# Patient Record
Sex: Male | Born: 1965 | Race: White | Hispanic: No | Marital: Married | Smoking: Never smoker
Health system: Southern US, Community
[De-identification: ages and names within clinical notes are randomized; demographics above are authoritative.]

## PROBLEM LIST (undated history)

## (undated) DIAGNOSIS — N200 Calculus of kidney: Secondary | ICD-10-CM

## (undated) HISTORY — PX: LITHOTRIPSY: SUR834

---

## 2018-02-17 ENCOUNTER — Encounter (HOSPITAL_BASED_OUTPATIENT_CLINIC_OR_DEPARTMENT_OTHER): Payer: Self-pay | Admitting: *Deleted

## 2018-02-17 ENCOUNTER — Emergency Department (HOSPITAL_BASED_OUTPATIENT_CLINIC_OR_DEPARTMENT_OTHER)
Admission: EM | Admit: 2018-02-17 | Discharge: 2018-02-17 | Disposition: A | Discharge status: Home / Self Care | Payer: BLUE CROSS/BLUE SHIELD | Attending: Emergency Medicine | Admitting: Emergency Medicine

## 2018-02-17 ENCOUNTER — Other Ambulatory Visit: Payer: Self-pay

## 2018-02-17 ENCOUNTER — Emergency Department (HOSPITAL_BASED_OUTPATIENT_CLINIC_OR_DEPARTMENT_OTHER): Payer: BLUE CROSS/BLUE SHIELD

## 2018-02-17 DIAGNOSIS — L0231 Cutaneous abscess of buttock: Secondary | ICD-10-CM | POA: Diagnosis not present

## 2018-02-17 DIAGNOSIS — R222 Localized swelling, mass and lump, trunk: Secondary | ICD-10-CM | POA: Diagnosis present

## 2018-02-17 HISTORY — DX: Calculus of kidney: N20.0

## 2018-02-17 LAB — COMPREHENSIVE METABOLIC PANEL
ALBUMIN: 3.6 g/dL (ref 3.5–5.0)
ALK PHOS: 74 U/L (ref 38–126)
ALT: 35 U/L (ref 17–63)
ANION GAP: 10 (ref 5–15)
AST: 23 U/L (ref 15–41)
BUN: 14 mg/dL (ref 6–20)
CALCIUM: 8.5 mg/dL — AB (ref 8.9–10.3)
CO2: 21 mmol/L — AB (ref 22–32)
Chloride: 103 mmol/L (ref 101–111)
Creatinine, Ser: 1.21 mg/dL (ref 0.61–1.24)
GFR calc Af Amer: >60 mL/min (ref >60)
GFR calc non Af Amer: >60 mL/min (ref >60)
GLUCOSE: 107 mg/dL — AB (ref 65–99)
Potassium: 3.7 mmol/L (ref 3.5–5.1)
SODIUM: 134 mmol/L — AB (ref 135–145)
Total Bilirubin: 0.8 mg/dL (ref 0.3–1.2)
Total Protein: 7.1 g/dL (ref 6.5–8.1)

## 2018-02-17 LAB — CBC WITH DIFFERENTIAL/PLATELET
BASOS ABS: 0 10*3/uL (ref 0.0–0.1)
Basophils Relative: 0 %
EOS ABS: 0.1 10*3/uL (ref 0.0–0.7)
Eosinophils Relative: 0 %
HCT: 35.6 % — ABNORMAL LOW (ref 39.0–52.0)
HEMOGLOBIN: 12.2 g/dL — AB (ref 13.0–17.0)
Lymphocytes Relative: 9 %
Lymphs Abs: 1.6 10*3/uL (ref 0.7–4.0)
MCH: 28.3 pg (ref 26.0–34.0)
MCHC: 34.3 g/dL (ref 30.0–36.0)
MCV: 82.6 fL (ref 78.0–100.0)
Monocytes Absolute: 1.4 10*3/uL — ABNORMAL HIGH (ref 0.1–1.0)
Monocytes Relative: 8 %
NEUTROS PCT: 83 %
Neutro Abs: 14 10*3/uL — ABNORMAL HIGH (ref 1.7–7.7)
Platelets: 295 10*3/uL (ref 150–400)
RBC: 4.31 MIL/uL (ref 4.22–5.81)
RDW: 12.5 % (ref 11.5–15.5)
WBC: 17.1 10*3/uL — ABNORMAL HIGH (ref 4.0–10.5)

## 2018-02-17 IMAGING — CT CT ABD-PELV W/ CM
2 of 5 series · 16 of 46 positions shown, 18 images · IV contrast (APPLIED)
Comparison: None.

CLINICAL DATA: Perirectal abscess right buttock

EXAM:
CT ABDOMEN AND PELVIS WITH CONTRAST
TECHNIQUE: Multidetector CT imaging of the abdomen and pelvis was performed
using the standard protocol following bolus administration of
intravenous contrast.
CONTRAST:  100mL [DF] IOPAMIDOL ([DF]) INJECTION 61%

[Series 2: axial st · axial · 0.86mm/px · z∈[-729,-239]mm · 13 of 110 slices shown, 15 images]
[im 6/110  soft-tissue]
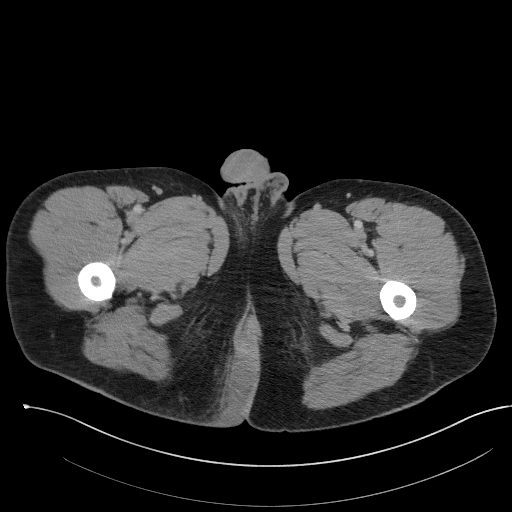
[im 6/110  bone]
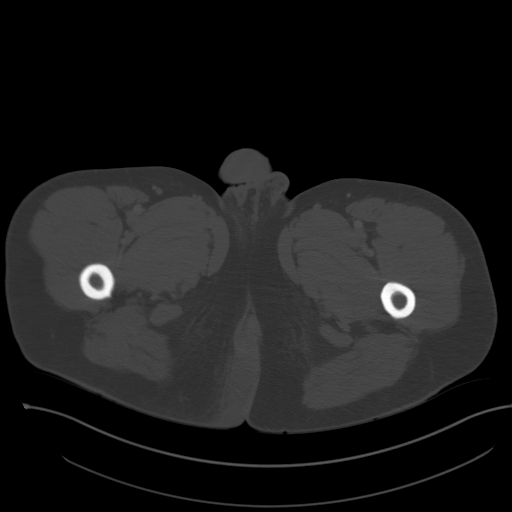
[im 18/110  soft-tissue]
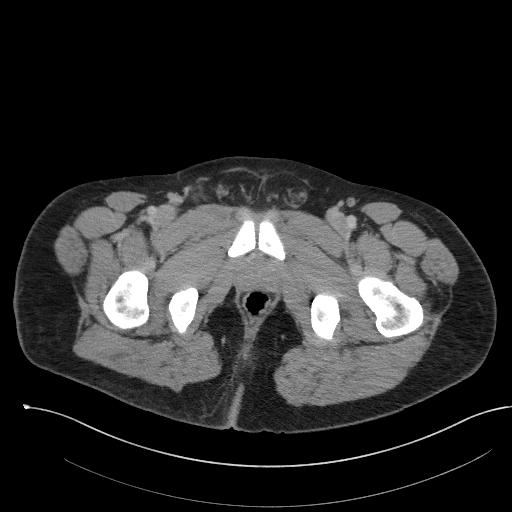
[im 23/110  soft-tissue]
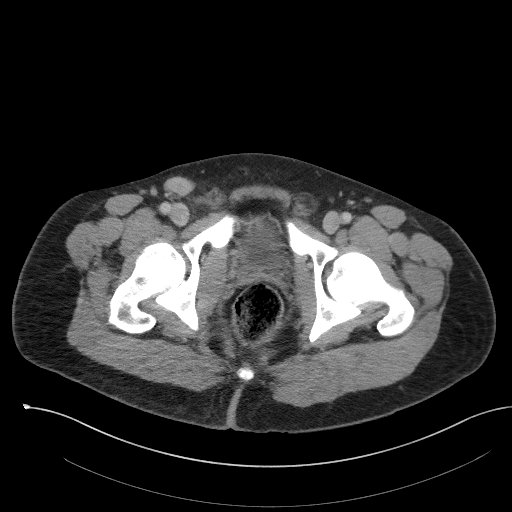
[im 29/110  soft-tissue]
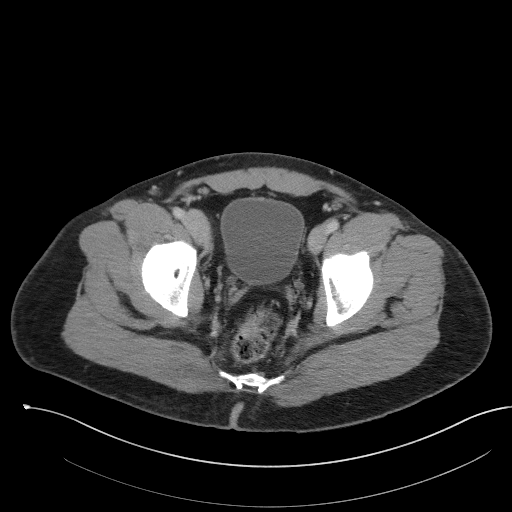
[im 41/110  soft-tissue]
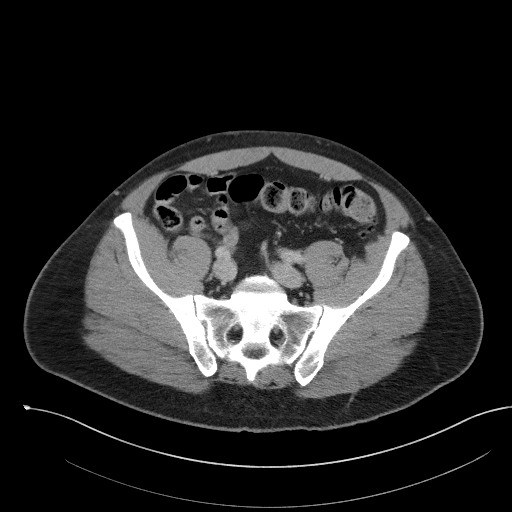
[im 46/110  soft-tissue]
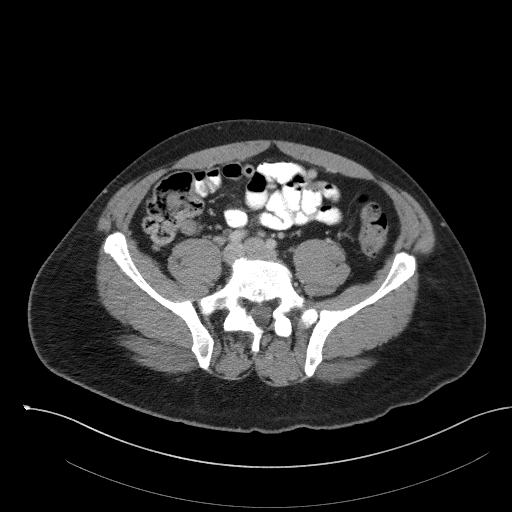
[im 58/110  soft-tissue]
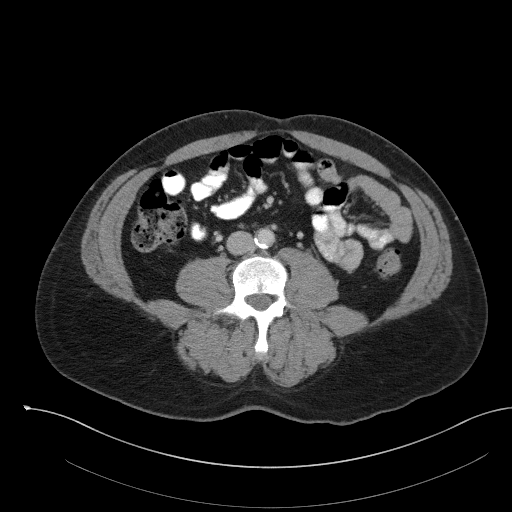
[im 64/110  soft-tissue]
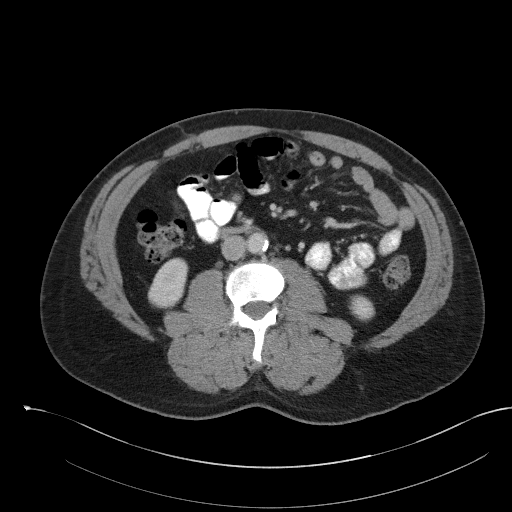
[im 69/110  soft-tissue]
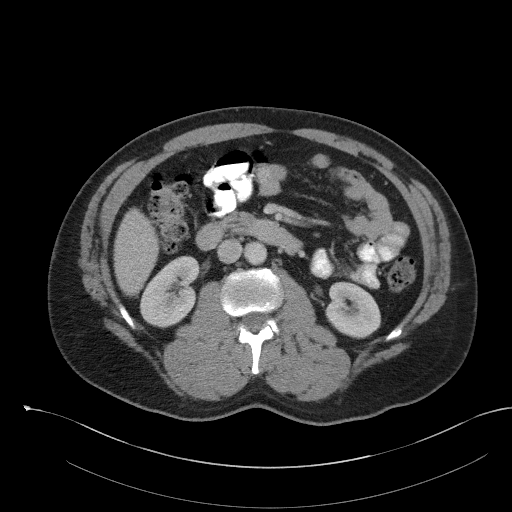
[im 69/110  bone]
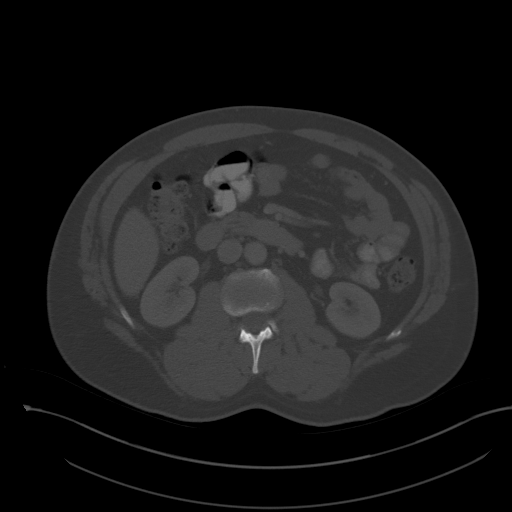
[im 81/110  soft-tissue]
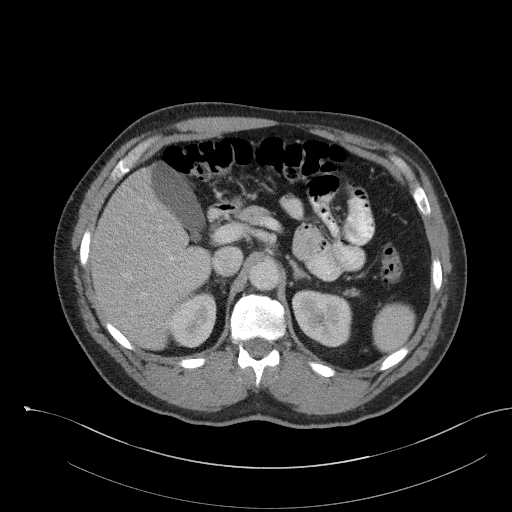
[im 87/110  soft-tissue]
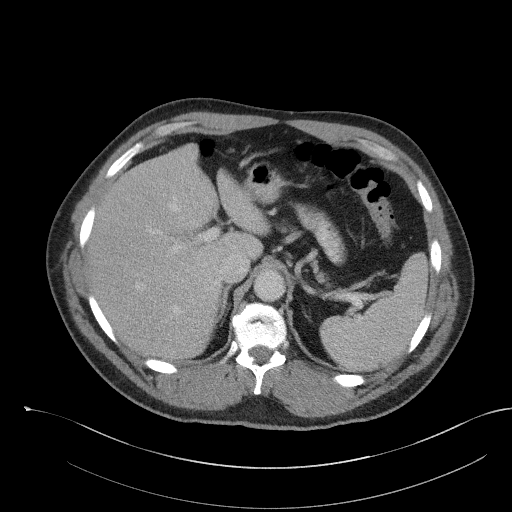
[im 92/110  soft-tissue]
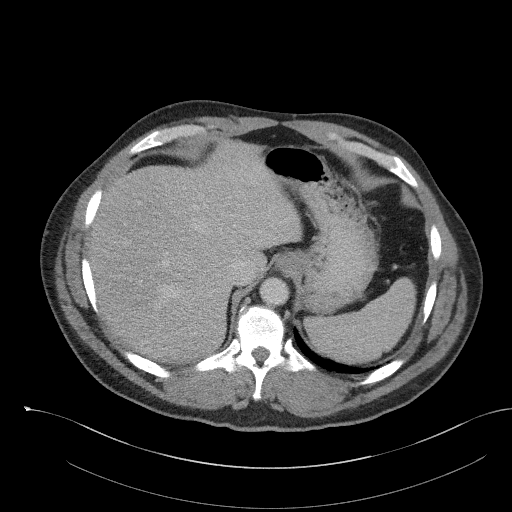
[im 104/110  soft-tissue]
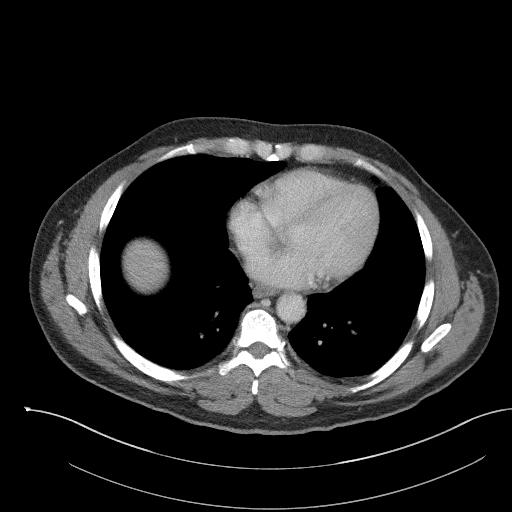

[Series 5: coronal st · coronal · 0.87mm/px · 3 of 101 slices shown]
[im 34/101  soft-tissue]
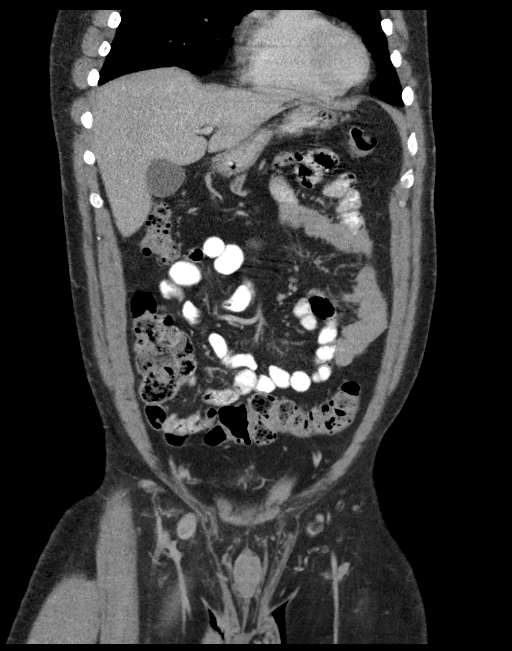
[im 45/101  soft-tissue]
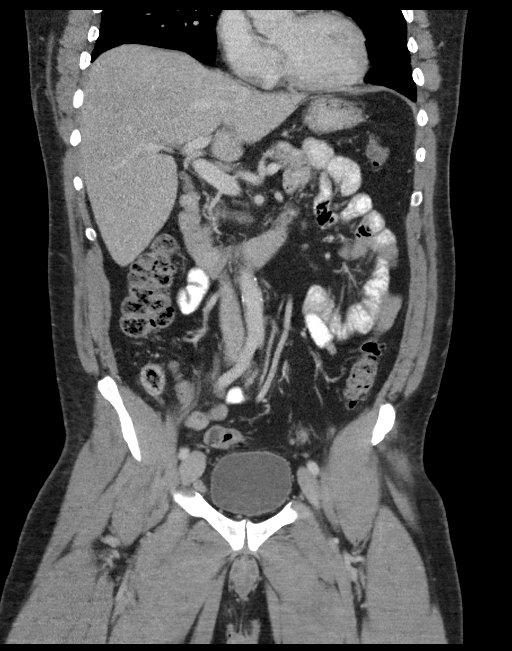
[im 56/101  soft-tissue]
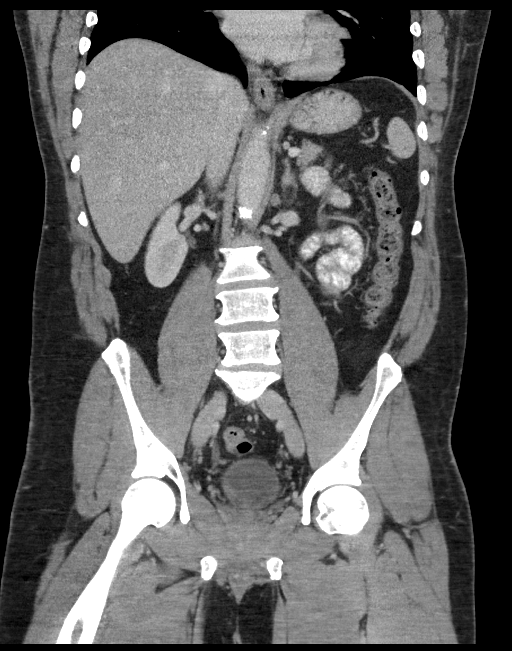

[16 of 46 positions shown; findings below may reference images not displayed]

FINDINGS: Lower chest: Minimal dependent atelectasis in the right lung base.

Hepatobiliary: Mild fatty infiltration of the liver. Gallbladder
unremarkable.

Pancreas: No focal abnormality or ductal dilatation.

Spleen: No focal abnormality.  Normal size.

Adrenals/Urinary Tract: No adrenal abnormality. No focal renal
abnormality. No stones or hydronephrosis. Urinary bladder is
unremarkable.

Stomach/Bowel: Few scattered descending colonic and sigmoid
diverticula. No active diverticulitis. Appendix is normal. Stomach
and small bowel decompressed, unremarkable.

Vascular/Lymphatic: Aortic atherosclerosis. No enlarged abdominal or
pelvic lymph nodes.

Reproductive: No visible focal abnormality.

Other: No free fluid or free air. There is stranding within the
medial right buttock subcutaneous soft tissues with probable fluid
collection measuring 5.9 x 1.9 cm. Findings compatible with early
perirectal abscess or phlegmon.

Musculoskeletal: No acute bony abnormality.
IMPRESSION: 5.9 x 1.9 cm low-density area in the medial buttock soft tissues at
the skin surface which could reflect early abscess or phlegmon.

Left colonic diverticulosis.  No active diverticulitis.

Suspect early/mild fatty infiltration of the liver.

## 2018-02-17 MED ORDER — LIDOCAINE HCL 2 % IJ SOLN
20.0000 mL | Freq: Once | INTRAMUSCULAR | Status: AC
  Administered 2018-02-17: 400 mg via INTRADERMAL
  Filled 2018-02-17: qty 20

## 2018-02-17 MED ORDER — KETOROLAC TROMETHAMINE 30 MG/ML IJ SOLN
30.0000 mg | Freq: Once | INTRAMUSCULAR | Status: AC
  Administered 2018-02-17: 30 mg via INTRAVENOUS
  Filled 2018-02-17: qty 1

## 2018-02-17 MED ORDER — SODIUM CHLORIDE 0.9 % IV BOLUS (SEPSIS)
1000.0000 mL | Freq: Once | INTRAVENOUS | Status: AC
  Administered 2018-02-17: 1000 mL via INTRAVENOUS

## 2018-02-17 MED ORDER — DOXYCYCLINE HYCLATE 100 MG PO CAPS: 100.0000 mg | ORAL_CAPSULE | Freq: Two times a day (BID) | ORAL | 0 refills | Status: AC

## 2018-02-17 MED ORDER — DOXYCYCLINE HYCLATE 100 MG PO TABS
100.0000 mg | ORAL_TABLET | Freq: Once | ORAL | Status: AC
  Administered 2018-02-17: 100 mg via ORAL
  Filled 2018-02-17: qty 1

## 2018-02-17 MED ORDER — IOPAMIDOL (ISOVUE-300) INJECTION 61%
100.0000 mL | Freq: Once | INTRAVENOUS | Status: AC | PRN
  Administered 2018-02-17: 100 mL via INTRAVENOUS

## 2018-02-17 NOTE — ED Provider Notes (Signed)
[facility] EMERGENCY DEPARTMENT Provider Note   CSN: [id number] Arrival date & time: 02/17/18  1637     History   Chief Complaint Chief Complaint  Patient presents with  . Abscess    HPI Xavier Neal is a 52 y.o. male who presents for evaluation of 6 days of right-sided gluteal redness, pain, swelling.  Patient reports that 6 days ago, he noticed some slight irritation to the right gluteal area.  He states that since then, the area has progressively worsened in severity and incised.  He states that he was seen in urgent care earlier today for evaluation of symptoms.  Today at urgent care, he had a temperature of 101.0 and was referred to the emergency department for further evaluation.  Patient states that the pain is worsened by palpation and by sitting.  He reports that he has been taking aspirin and Tylenol with minimal improvement in symptoms.  Patient states he is still able to have normal bowel movements without any difficulty.  Patient denies any testicular pain, swelling, penile pain.  The history is provided by the patient.    Past Medical History:  Diagnosis Date  . Kidney stones     There are no active problems to display for this patient.   Past Surgical History:  Procedure Laterality Date  . LITHOTRIPSY         Home Medications    Prior to Admission medications   Medication Sig Start Date End Date Taking? Authorizing Provider  acetaminophen (TYLENOL) 325 MG tablet Take 1,000 mg by mouth every 6 (six) hours as needed.   Yes [provider]  doxycycline (VIBRAMYCIN) 100 MG capsule Take 1 capsule (100 mg total) by mouth 2 (two) times daily for 7 days. 02/17/18 02/24/18  Maxwell Caul, PA-C    Family History History reviewed. No pertinent family history.  Social History Social History   Tobacco Use  . Smoking status: Never Smoker  . Smokeless tobacco: Never Used  Substance Use Topics  . Alcohol use: No    Frequency: Never  . Drug  use: No     Allergies   Penicillins   Review of Systems Review of Systems  Constitutional: Positive for fever.  Gastrointestinal: Negative for constipation and diarrhea.  Genitourinary: Negative for penile pain, penile swelling, scrotal swelling and testicular pain.  Skin: Positive for color change and wound.     Physical Exam Updated Vital Signs BP 129/62 (BP Location: Right Arm)   Pulse 89   Temp 99.3 F (37.4 C) (Oral)   Resp 16   Ht 5\' 7"  (1.702 m)   Wt 92.1 kg (203 lb)   SpO2 99%   BMI 31.79 kg/m   Physical Exam  Constitutional: He appears well-developed and well-nourished.  HENT:  Head: Normocephalic and atraumatic.  Eyes: Conjunctivae and EOM are normal. Right eye exhibits no discharge. Left eye exhibits no discharge. No scleral icterus.  Pulmonary/Chest: Effort normal.  Genitourinary:  Genitourinary Comments: The exam was performed with a chaperone present.  Digital rectal exam reveals no mass or fluctuance.  No tenderness noted on rectal exam.  The left gluteal region has a 10 x 4 cm area of warmth, induration with central fluctuance noted.  It appears to extend down into the gluteal cleft.   Neurological: He is alert.  Skin: Skin is warm and dry.  Psychiatric: He has a normal mood and affect. His speech is normal and behavior is normal.  Nursing note and vitals reviewed.  ED Treatments / Results  Labs (all labs ordered are listed, but only abnormal results are displayed) Labs Reviewed  CBC WITH DIFFERENTIAL/PLATELET - Abnormal; Notable for the following components:      Result Value   WBC 17.1 (*)    Hemoglobin 12.2 (*)    HCT 35.6 (*)    Neutro Abs 14.0 (*)    Monocytes Absolute 1.4 (*)    All other components within normal limits  COMPREHENSIVE METABOLIC PANEL - Abnormal; Notable for the following components:   Sodium 134 (*)    CO2 21 (*)    Glucose, Bld 107 (*)    Calcium 8.5 (*)    All other components within normal limits     EKG  EKG Interpretation None       Radiology Ct Abdomen Pelvis W Contrast  Result Date: 02/17/2018 CLINICAL DATA:  Perirectal abscess right buttock EXAM: CT ABDOMEN AND PELVIS WITH CONTRAST TECHNIQUE: Multidetector CT imaging of the abdomen and pelvis was performed using the standard protocol following bolus administration of intravenous contrast. CONTRAST:  ISOVUE-300 IOPAMIDOL (ISOVUE-300) INJECTION 61% COMPARISON:  None. FINDINGS: Lower chest: Minimal dependent atelectasis in the right lung base. Hepatobiliary: Mild fatty infiltration of the liver. Gallbladder unremarkable. Pancreas: No focal abnormality or ductal dilatation. Spleen: No focal abnormality.  Normal size. Adrenals/Urinary Tract: No adrenal abnormality. No focal renal abnormality. No stones or hydronephrosis. Urinary bladder is unremarkable. Stomach/Bowel: Few scattered descending colonic and sigmoid diverticula. No active diverticulitis. Appendix is normal. Stomach and small bowel decompressed, unremarkable. Vascular/Lymphatic: Aortic atherosclerosis. No enlarged abdominal or pelvic lymph nodes. Reproductive: No visible focal abnormality. Other: No free fluid or free air. There is stranding within the medial right buttock subcutaneous soft tissues with probable fluid collection measuring 5.9 x 1.9 cm. Findings compatible with early perirectal abscess or phlegmon. Musculoskeletal: No acute bony abnormality. IMPRESSION: 5.9 x 1.9 cm low-density area in the medial buttock soft tissues at the skin surface which could reflect early abscess or phlegmon. Left colonic diverticulosis.  No active diverticulitis. Suspect early/mild fatty infiltration of the liver. Electronically Signed   By: Charlett Nose M.D.   On: 02/17/2018 19:19    Procedures .Marland KitchenIncision and Drainage Date/Time: 02/17/2018 10:42 PM Performed by: Maxwell Caul, PA-C Authorized by: Maxwell Caul, PA-C   Consent:    Consent obtained:  Verbal   Consent  given by:  Patient   Risks discussed:  Bleeding, incomplete drainage and infection   Alternatives discussed:  No treatment Location:    Type:  Abscess   Location:  Anogenital   Anogenital location:  Perianal Pre-procedure details:    Skin preparation:  Betadine Anesthesia (see MAR for exact dosages):    Anesthesia method:  Local infiltration   Local anesthetic:  Lidocaine 2% w/o epi Procedure type:    Complexity:  Simple Procedure details:    Incision types:  Single straight   Scalpel blade:  11   Wound management:  Probed and deloculated, irrigated with saline and extensive cleaning   Drainage:  Purulent   Drainage amount:  Copious   Wound treatment:  Drain placed   Packing materials:  1/2 in gauze Post-procedure details:    Patient tolerance of procedure:  Tolerated well, no immediate complications   (including critical care time)  Medications Ordered in ED Medications  sodium chloride 0.9 % bolus 1,000 mL (0 mLs Intravenous Stopped 02/17/18 1851)  ketorolac (TORADOL) 30 MG/ML injection 30 mg (30 mg Intravenous Given 02/17/18 1730)  lidocaine (XYLOCAINE)  2 % (with pres) injection 400 mg (400 mg Intradermal Given by Other 02/17/18 1731)  iopamidol (ISOVUE-300) 61 % injection 100 mL (100 mLs Intravenous Contrast Given 02/17/18 1854)  doxycycline (VIBRA-TABS) tablet 100 mg (100 mg Oral Given 02/17/18 2059)     Initial Impression / Assessment and Plan / ED Course  I have reviewed the triage vital signs and the nursing notes.  Pertinent labs & imaging results that were available during my care of the patient were reviewed by me and considered in my medical decision making (see chart for details).     52 year old male who presents for evaluation of 6 days of redness, swelling, pain to the right gluteal region.  Febrile at urgent care this afternoon was sent to ED for further evaluation.  On initial ED arrival, patient is slightly febrile here.  Vitals otherwise stable.  On exam,  patient has an area of diffuse erythema, induration, swelling with central fluctuance noted to the right gluteal area.  It does extend pretty extensively.  Digital rectal exam reveals no tenderness, mass or fluctuance.  Given concerns of fever and extensiveness of the area, will plan for basic labs, CT pelvis for evaluation of abscess.  Labs reviewed.  CBC shows leukocytosis of 17.1.  CMP shows bicarb of 21 otherwise unremarkable.  CT abdomen pelvis shows a 6 x 2 cm area that could represent an early perirectal abscess with surrounding cellulitis.  Given findings, will plan to consult general surgery for further evaluation.  Discussed patient with Dr. Maisie Fushomas (Gen Surg) regarding CT findings. Given that there is fluctuance noted in the emergency department, she recommends doing I&D here in the emergency department.  Does not feel that it needs surgical evaluation for drainage.  She recommends having patient follow-up in the outpatient office in 2 days.  I&D as documented above.  Patient tolerated procedure well.  The wound was packed with 1/2 gauze.  Patient will be started on antibiotics.  He is allergic to penicillins.  CMP shows normal LFTs.  We will plan to start him on doxycycline.  Patient instructed to follow-up with the emergency department or with the surgery office in 2 days for wound recheck and packing removal.  Vital signs stable. Patient had ample opportunity for questions and discussion. All patient's questions were answered with full understanding. Strict return precautions discussed. Patient expresses understanding and agreement to plan.   Final Clinical Impressions(s) / ED Diagnoses   Final diagnoses:  Abscess of buttock, right    ED Discharge Orders        Ordered    doxycycline (VIBRAMYCIN) 100 MG capsule  2 times daily     02/17/18 2044       Rosana HoesLayden, Lindsey A, PA-C 02/17/18 2245    Maia PlanLong, Joshua G, MD 02/18/18 1431

## 2018-02-17 NOTE — ED Notes (Signed)
Pt verbalizes understanding of d/c instructions and denies any further needs at this time. 

## 2018-02-17 NOTE — ED Notes (Signed)
Pt up to the restroom

## 2018-02-17 NOTE — ED Notes (Signed)
Paged [facility]

## 2018-02-17 NOTE — ED Notes (Signed)
Patient transported to CT 

## 2018-02-17 NOTE — ED Triage Notes (Addendum)
pt c/o abscess to right buttocks x 6 days also c/o fever 101.0, sent here from [facility] for eval given tylenol PTA

## 2018-02-17 NOTE — Discharge Instructions (Signed)
Take antibiotics as directed. Please take all of your antibiotics until finished.  As we discussed, you will need to either return to the emergency department or follow-up with the surgery office in 2 days to have the packing removed and have the wound recheck.  You can take Tylenol or Ibuprofen as directed for pain. You can alternate Tylenol and Ibuprofen every 4 hours. If you take Tylenol at 1pm, then you can take Ibuprofen at 5pm. Then you can take Tylenol again at 9pm.   Return to the emergency department sooner if you experience persistent fever, worsening pain, spreading of the redness or swelling, abdominal pain, vomiting or any other worsening or concerning symptoms.

## 2022-06-27 IMAGING — MR MRI FOOT RT WO CONTRAST
4 of 6 series · 19 of 40 positions shown · IV contrast (gadolinium)
Comparison: None available.

﻿EXAM:  [DATE]   MRI ANKLE RT WO CONTRAST,MRI FOOT RT WO CONTRAST
INDICATION: Chronic pain.
TECHNIQUE: Multiplanar multisequential MRI of the right ankle and foot was performed without gadolinium contrast.

[Series 4: T1 · axial · right · 4.0mm · 0.53mm/px · z∈[-98,+5]mm · 5 of 22 slices shown (1 of 3)]
[im 1/22]
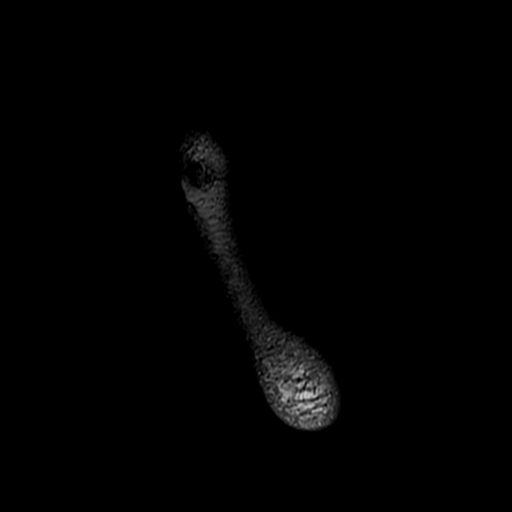
[im 6/22]
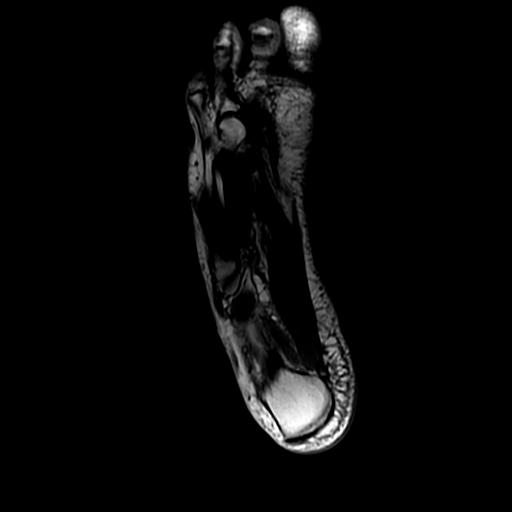
[im 11/22]
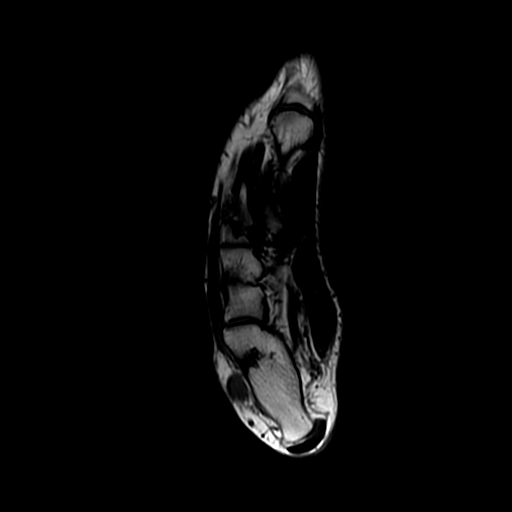
[im 16/22]
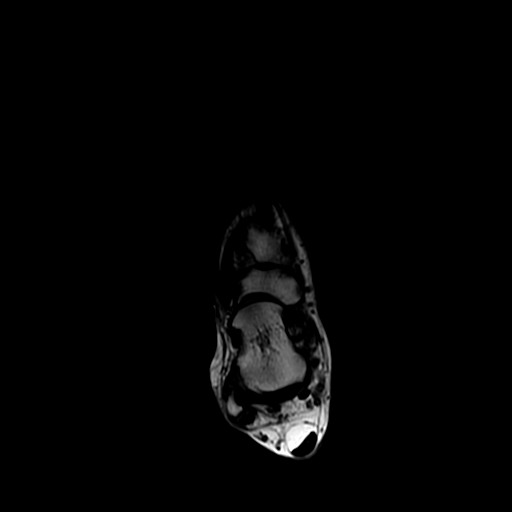
[im 22/22]
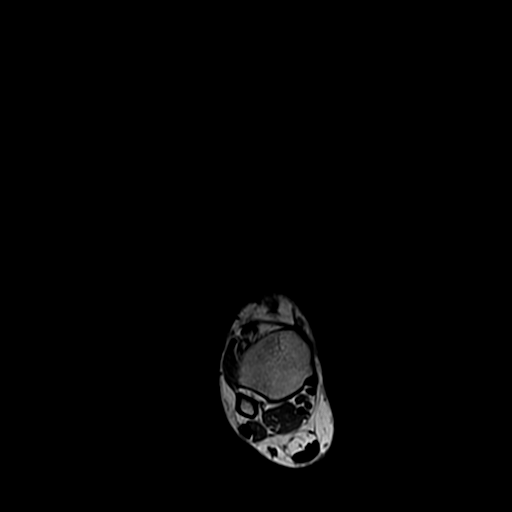

[Series 5: T2 fat-sat · axial · right · 4.0mm · 0.53mm/px · z∈[-98,+5]mm · 5 of 22 slices shown]
[im 1/22]
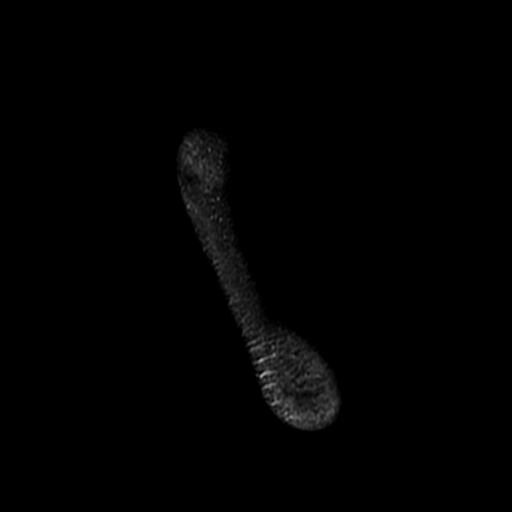
[im 6/22]
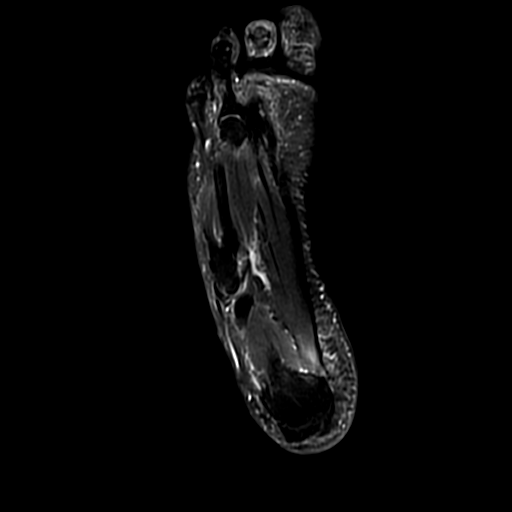
[im 11/22]
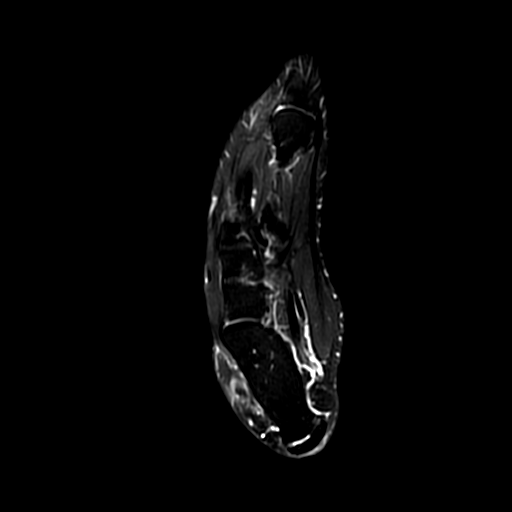
[im 16/22]
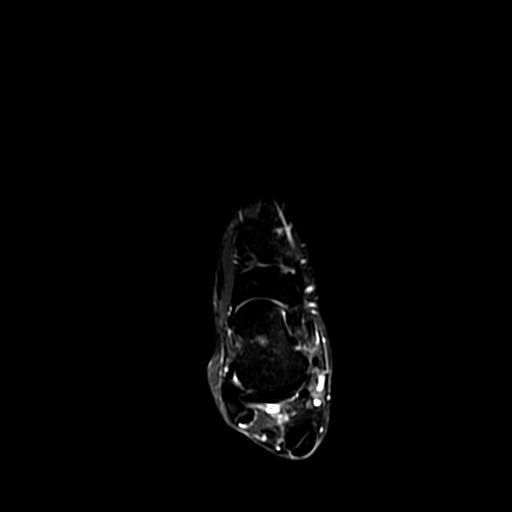
[im 22/22]
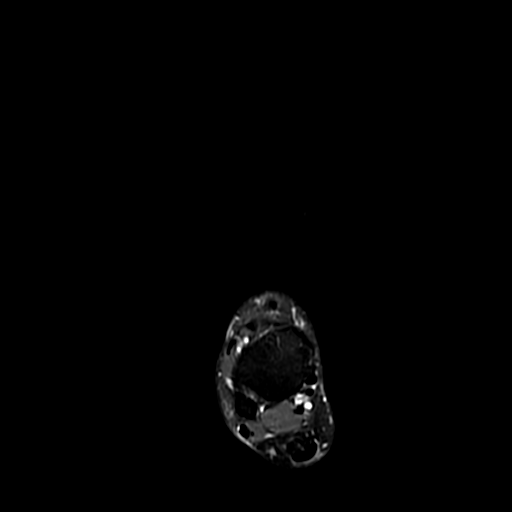

[Series 9: T1 · coronal · right · 5.5mm · 0.29mm/px · 6 of 36 slices shown (2 of 3)]
[im 1/36]
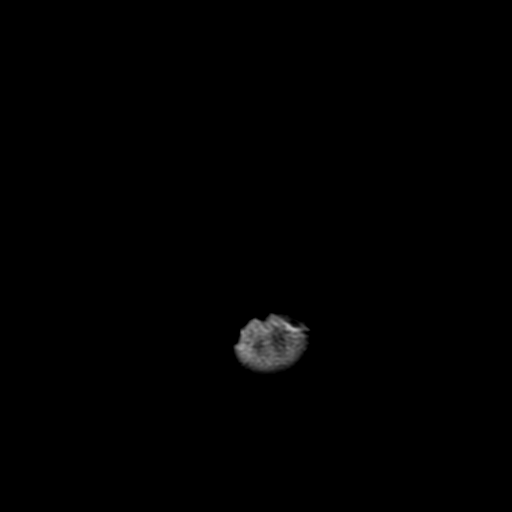
[im 5/36]
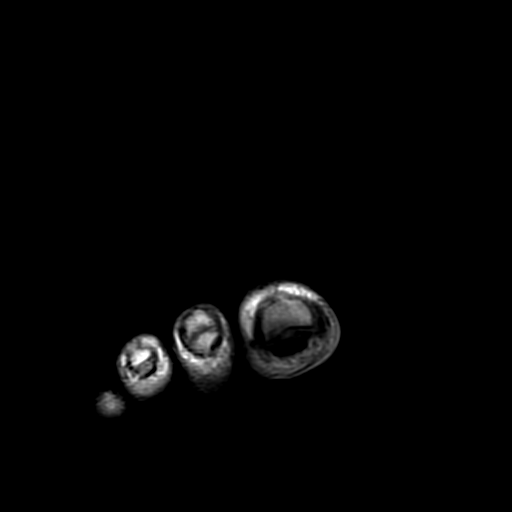
[im 9/36]
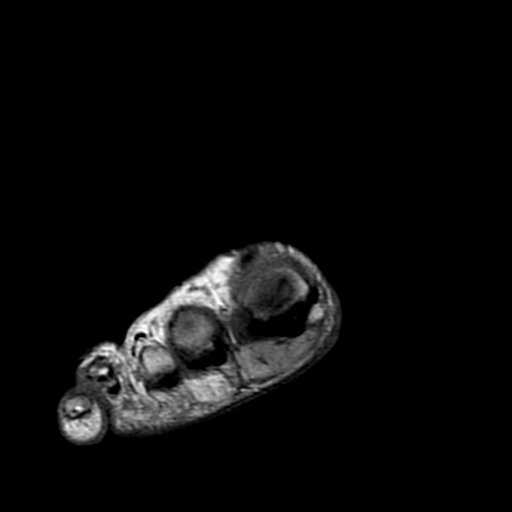
[im 14/36]
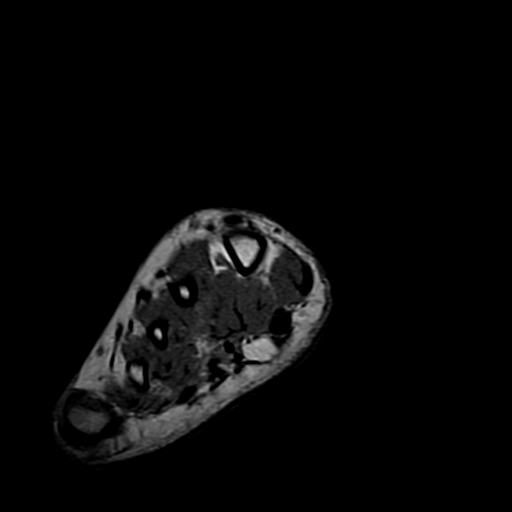
[im 18/36]
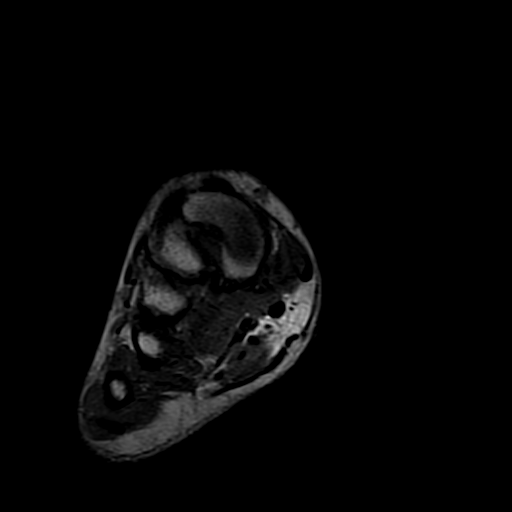
[im 31/36]
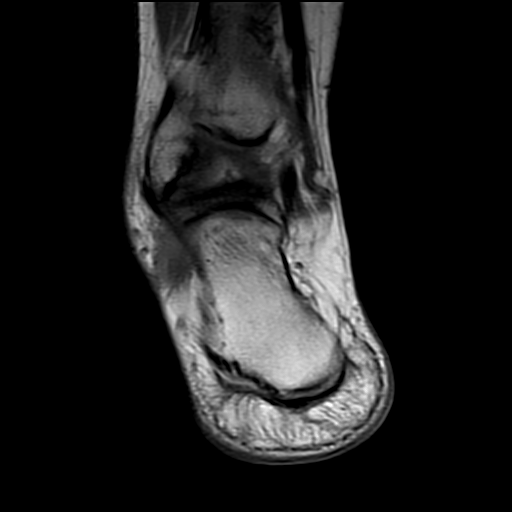

[Series 10: T1 · sagittal · right · 4.0mm · 0.53mm/px · 3 of 22 slices shown (3 of 3)]
[im 5/22]
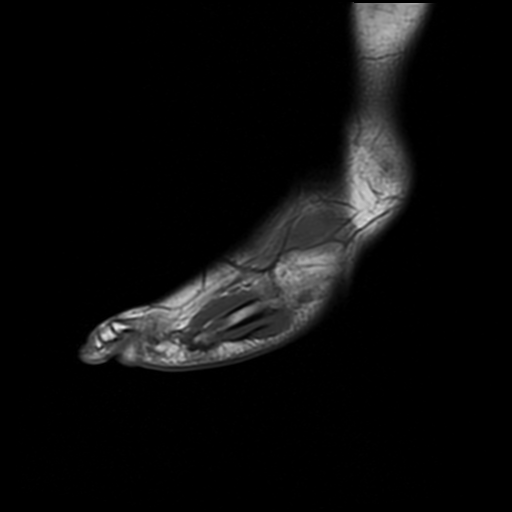
[im 13/22]
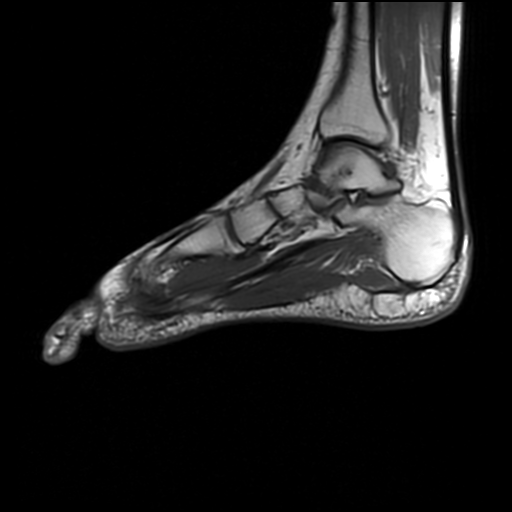
[im 22/22]
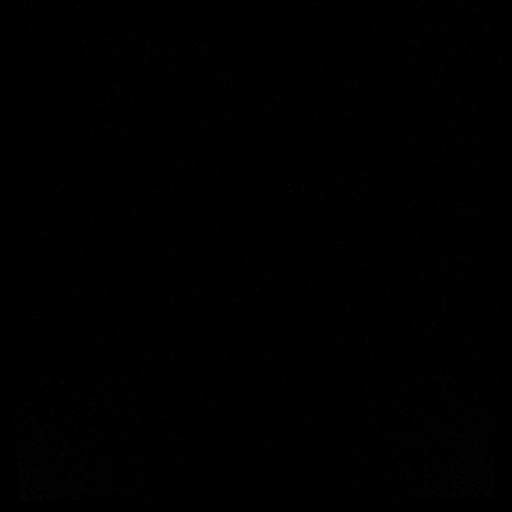

[19 of 40 positions shown; findings below may reference images not displayed]

FINDINGS: Bone marrow signal intensity is normal.  There is no acute fracture or subluxation. There is no osteochondral lesion of the talar dome.  No significant arthritic changes seen within the ankle and foot.  There is normal T1 signal intensity within the sinus tarsi.  There is mild tibialis posterior and peroneal tenosynovitis.  Inferior syndesmotic, talofibular, calcaneofibular and deltoid ligaments are normal.  Lisfranc ligament is also intact.  Intrinsic muscles of the forefoot are normal without definite mass or evidence of denervation atrophy.  There is mild-to-moderate plantar fasciitis.
IMPRESSION: 1. Mild tibialis posterior and peroneal tenosynovitis.  

2. Mild-to-moderate plantar fasciitis.

## 2022-06-27 IMAGING — MR MRI ANKLE RT WO CONTRAST
4 of 6 series · 23 of 40 positions shown · IV contrast (gadolinium)
Comparison: None available.

﻿EXAM:  [DATE]   MRI ANKLE RT WO CONTRAST,MRI FOOT RT WO CONTRAST
INDICATION: Chronic pain.
TECHNIQUE: Multiplanar multisequential MRI of the right ankle and foot was performed without gadolinium contrast.

[Series 8: T1 · sagittal · right · 3.5mm · 0.38mm/px · 5 of 20 slices shown (1 of 3)]
[im 1/20]
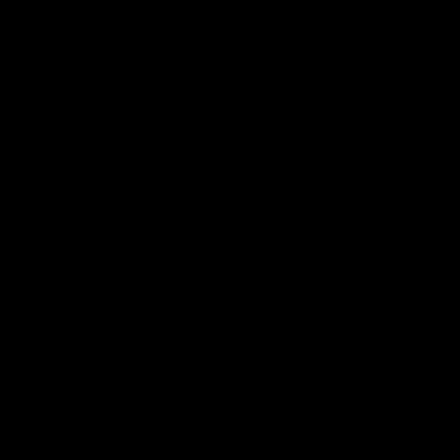
[im 5/20]
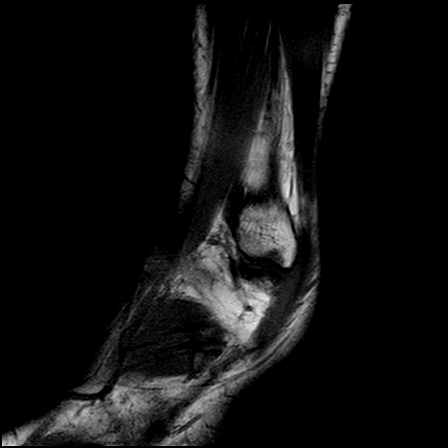
[im 10/20]
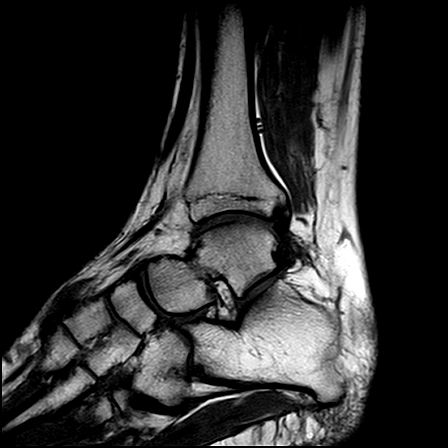
[im 15/20]
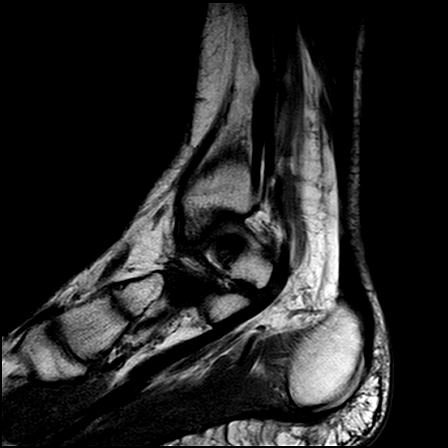
[im 20/20]
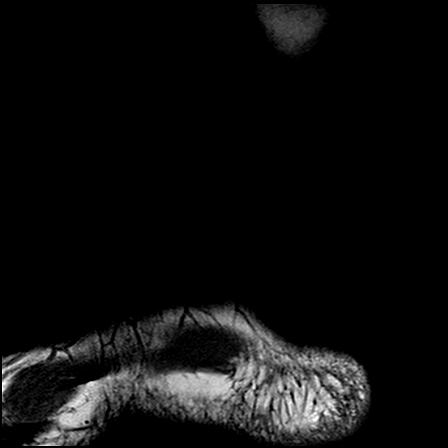

[Series 10: T1 · axial · right · 4.5mm · 0.33mm/px · z∈[-38,+77]mm · 7 of 28 slices shown (2 of 3)]
[im 1/28]
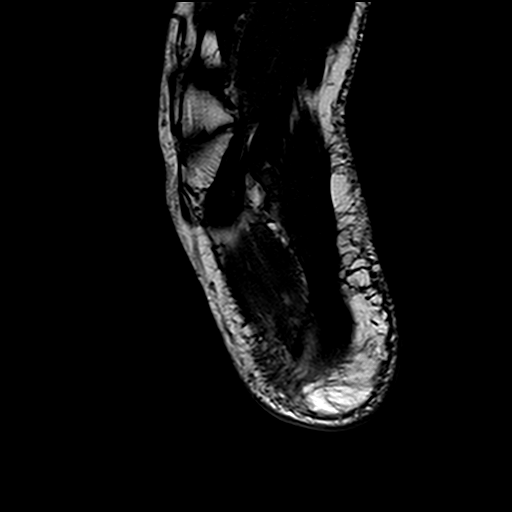
[im 4/28]
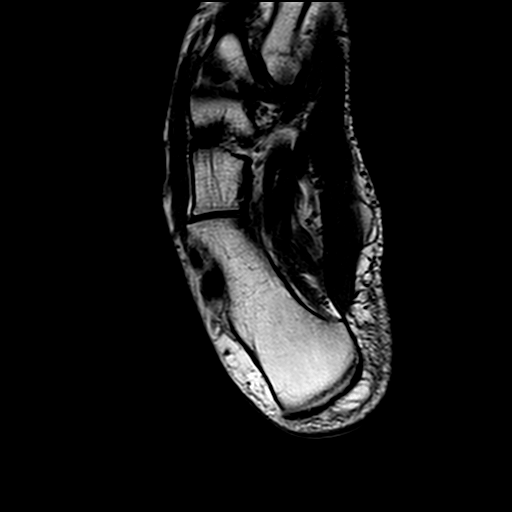
[im 8/28]
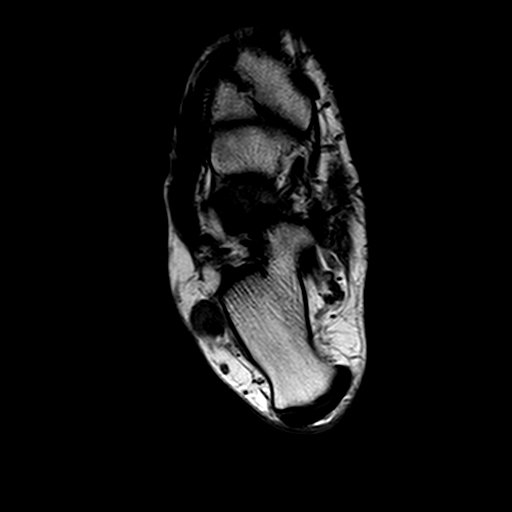
[im 12/28]
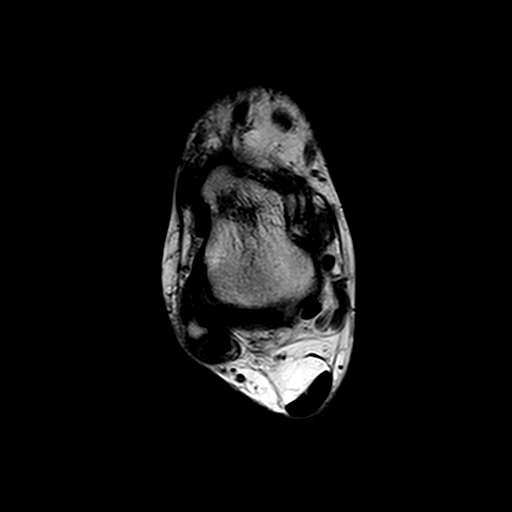
[im 16/28]
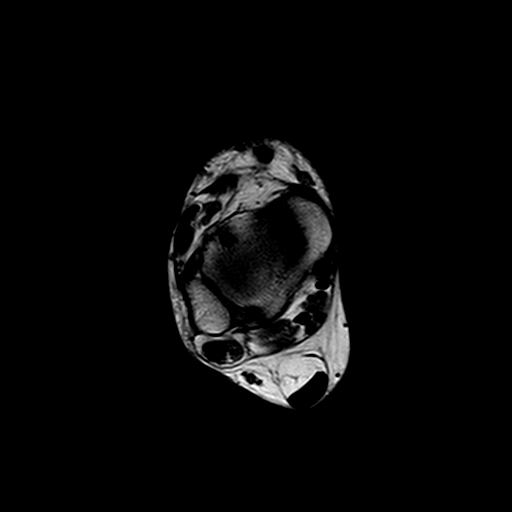
[im 20/28]
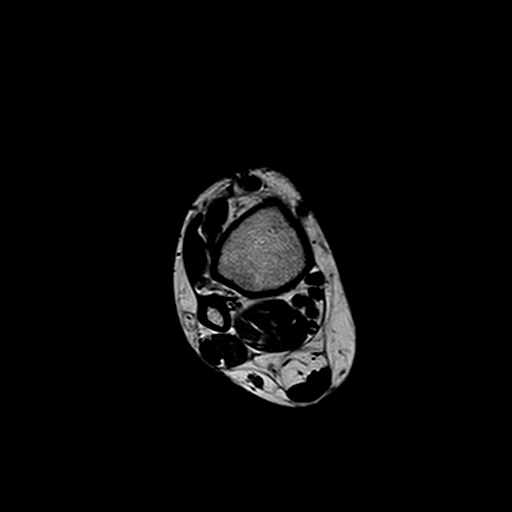
[im 24/28]
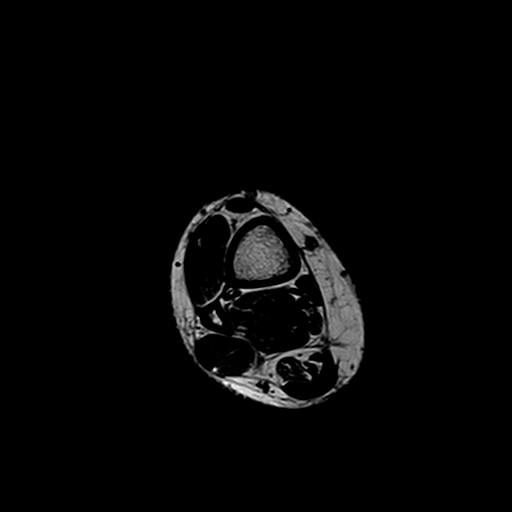

[Series 11: T2 fat-sat · axial · right · 4.5mm · 0.38mm/px · z∈[-38,+97]mm · 8 of 28 slices shown]
[im 1/28]
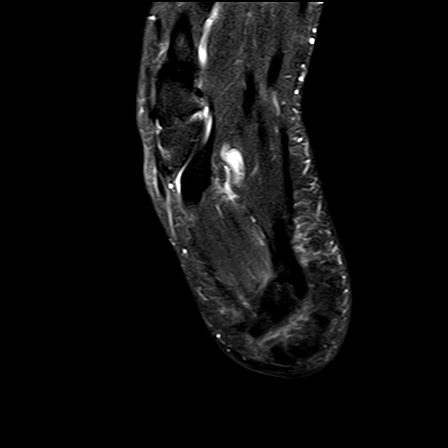
[im 4/28]
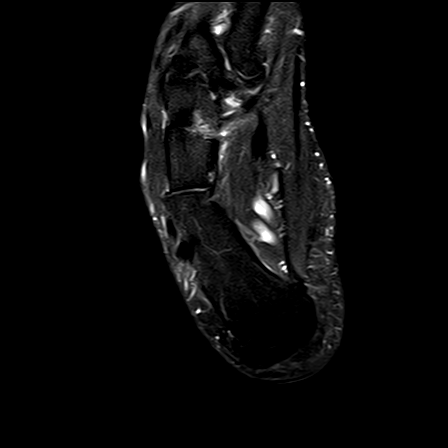
[im 8/28]
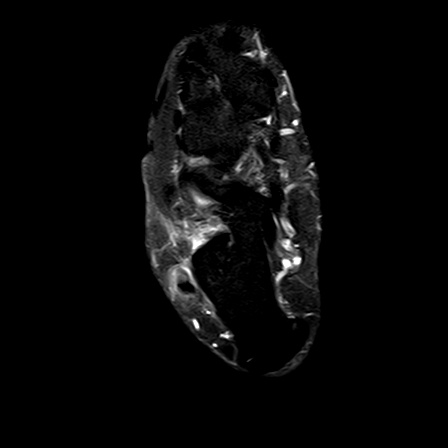
[im 12/28]
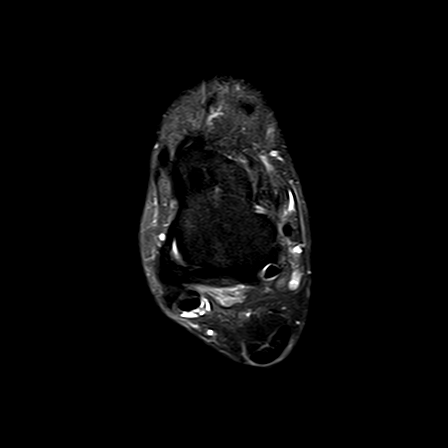
[im 16/28]
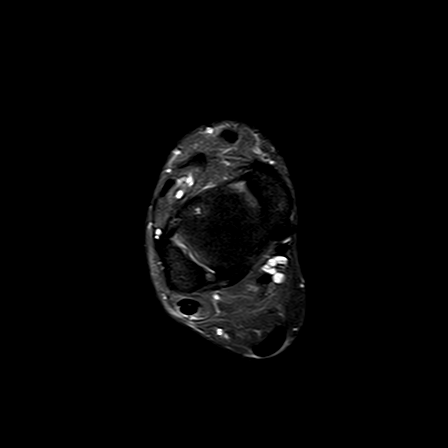
[im 20/28]
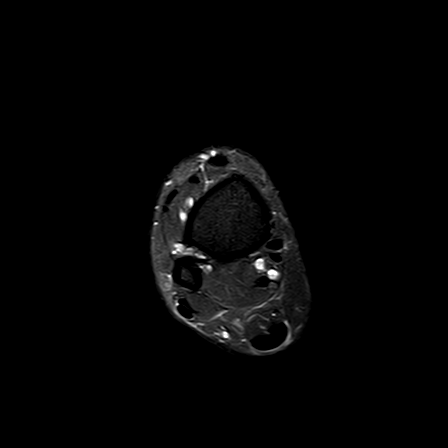
[im 24/28]
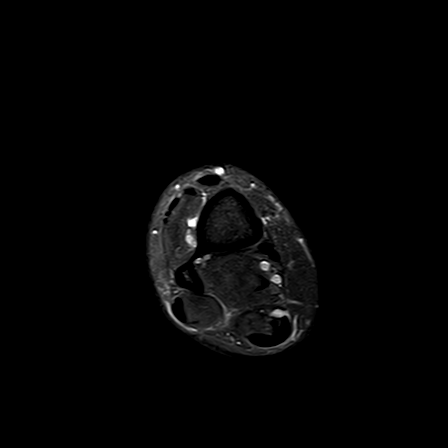
[im 28/28]
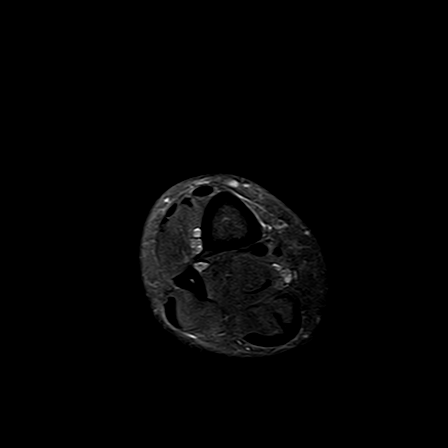

[Series 12: T1 · coronal · right · 4.0mm · 0.33mm/px · 3 of 26 slices shown (3 of 3)]
[im 5/26]
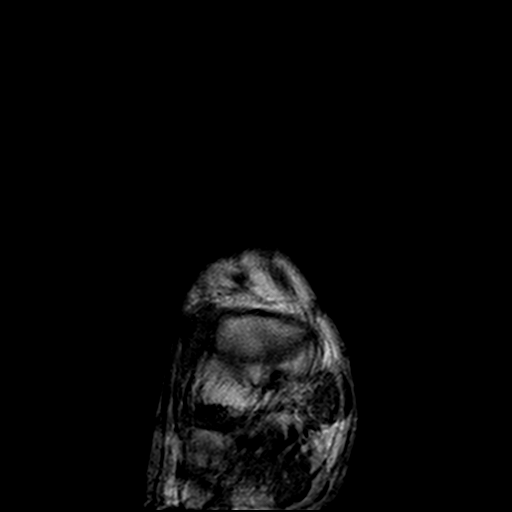
[im 13/26]
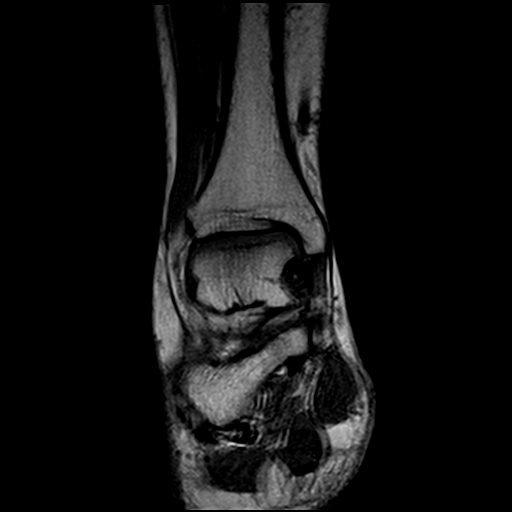
[im 21/26]
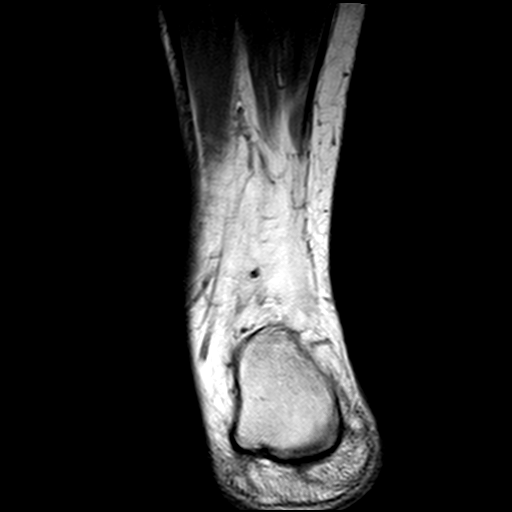

[23 of 40 positions shown; findings below may reference images not displayed]

FINDINGS: Bone marrow signal intensity is normal.  There is no acute fracture or subluxation. There is no osteochondral lesion of the talar dome.  No significant arthritic changes seen within the ankle and foot.  There is normal T1 signal intensity within the sinus tarsi.  There is mild tibialis posterior and peroneal tenosynovitis.  Inferior syndesmotic, talofibular, calcaneofibular and deltoid ligaments are normal.  Lisfranc ligament is also intact.  Intrinsic muscles of the forefoot are normal without definite mass or evidence of denervation atrophy.  There is mild-to-moderate plantar fasciitis.
IMPRESSION: 1. Mild tibialis posterior and peroneal tenosynovitis.  

2. Mild-to-moderate plantar fasciitis.
